# Patient Record
Sex: Male | Born: 1965 | Race: White | Hispanic: No | Marital: Married | Smoking: Never smoker
Health system: Southern US, Community
[De-identification: ages and names within clinical notes are randomized; demographics above are authoritative.]

## PROBLEM LIST (undated history)

## (undated) DIAGNOSIS — I493 Ventricular premature depolarization: Secondary | ICD-10-CM

## (undated) DIAGNOSIS — T7840XA Allergy, unspecified, initial encounter: Secondary | ICD-10-CM

## (undated) HISTORY — DX: Allergy, unspecified, initial encounter: T78.40XA

## (undated) HISTORY — PX: APPENDECTOMY: SHX54

## (undated) HISTORY — DX: Ventricular premature depolarization: I49.3

## (undated) HISTORY — PX: HERNIA REPAIR: SHX51

---

## 2005-06-11 ENCOUNTER — Emergency Department (HOSPITAL_COMMUNITY): Admission: EM | Admit: 2005-06-11 | Discharge: 2005-06-11 | Payer: Self-pay | Admitting: Emergency Medicine

## 2012-05-16 ENCOUNTER — Ambulatory Visit: Payer: PRIVATE HEALTH INSURANCE | Admitting: Emergency Medicine

## 2012-05-16 VITALS — BP 132/94 | HR 83 | Temp 97.8°F | Resp 16 | Ht 73.0 in | Wt 211.0 lb

## 2012-05-16 DIAGNOSIS — R51 Headache: Secondary | ICD-10-CM

## 2012-05-16 DIAGNOSIS — J329 Chronic sinusitis, unspecified: Secondary | ICD-10-CM

## 2012-05-16 DIAGNOSIS — Z23 Encounter for immunization: Secondary | ICD-10-CM

## 2012-05-16 MED ORDER — AMOXICILLIN-POT CLAVULANATE 875-125 MG PO TABS: 1.0000 | ORAL_TABLET | Freq: Two times a day (BID) | ORAL | Status: DC

## 2012-05-16 NOTE — Progress Notes (Signed)
  Subjective:    Patient ID: Leroy Pacheco, male    DOB: 24-Mar-1966, 47 y.o.   MRN: [id number]  HPIpatient complains of cold symptoms for one week. Has taken Zinc. Feeling worse this week. Has used a saline nasal rinse. Feels sinus pressure. Has cough, denies any fevers. Loss of vision at times during illness in right eye. No headache. Has had trouble in the past with loss of vision and headaches.      Review of Systems patient states for years he has had rare episodes of what sounds like ocular migraines. He has a visual disturbance associated with a cracked glass appearance. This is followed by a right temporal headache. He had these only rarely every 5-10 years but in the last week he has had 3 episodes 2 of which occurred yesterday. He develops a visual discomfort but does not get a headache.     Objective:   Physical Exam patient is alert cooperative in no distress. His pupils are equal and reactive to light. His disc margins are sharp. Cranial nerves are intact. He has no focal upper or lower extremity weakness in his neck is supple without adenopathy. His chest is clear to auscultation and percussion. His heart is regular rate without murmurs. There is tenderness over both maxillary sinuses and congestion of the turbinates        Assessment & Plan:  He is given instructions regarding treatment for sinusitis. We'll treat with Augmentin 875 twice a day. Referral has been made to neurology for evaluation of what sounds like ocular migraines.

## 2012-05-16 NOTE — Patient Instructions (Addendum)

## 2013-02-03 ENCOUNTER — Ambulatory Visit (INDEPENDENT_AMBULATORY_CARE_PROVIDER_SITE_OTHER): Payer: PRIVATE HEALTH INSURANCE | Admitting: Family Medicine

## 2013-02-03 VITALS — BP 122/78 | HR 71 | Temp 97.4°F | Resp 18 | Ht 73.0 in | Wt 213.0 lb

## 2013-02-03 DIAGNOSIS — R002 Palpitations: Secondary | ICD-10-CM

## 2013-02-03 DIAGNOSIS — I493 Ventricular premature depolarization: Secondary | ICD-10-CM

## 2013-02-03 DIAGNOSIS — I4949 Other premature depolarization: Secondary | ICD-10-CM

## 2013-02-03 NOTE — Progress Notes (Signed)
Is a 47 year old man is in [employer], is married, and is a Psychologist, prison and probation services. He is trace bicycles but several years ago he gave this up and start working out in the gym. He put on 50 pounds to bulk up.  Patient has a history of palpitations dating back to his college years. At that time he related the palpitations to drinking coffee and he was very sensitive to this. He gave up caffeine for while. It is racing bicycles he at one time took Claritin-D and which caused a severe reaction of facial flushing and rapid pulse.  In the last year, patient had an occasional cup of coffee. Yesterday and a couple coffee followed by significant palpitations all day. He again had a cup of coffee today and the palpitations were worse. He notices a funny tickle in his chest whenever the heart skips a beat. He's had no chest pain or shortness of breath however.  Objective: Middle-age man in no distress Chest: Clear Heart: Occasionally irregular with skipped beat, no murmur or gallop or rub. Skin: Clear  EKG: Patient has PVCs about every 8-10 beats.  Assessment: Is an athletic individual with a very sensitive electrical system of his heart. Unconcerned about the symptoms getting worse. I think that the caffeine is probably one of the triggers, any and 22 with Sudafed.  Plan: Check thyroid and lipid profiles along with a comprehensive metabolic profile.  I suspect patient will need a cardiology consult to consider beta-blockade.  Signed, Elvina Sidle, MD

## 2013-02-04 ENCOUNTER — Other Ambulatory Visit: Payer: Self-pay | Admitting: Family Medicine

## 2013-02-04 DIAGNOSIS — R002 Palpitations: Secondary | ICD-10-CM

## 2013-02-04 LAB — COMPREHENSIVE METABOLIC PANEL
ALT: 60 U/L — ABNORMAL HIGH (ref 0–53)
AST: 32 U/L (ref 0–37)
Albumin: 4.7 g/dL (ref 3.5–5.2)
Alkaline Phosphatase: 62 U/L (ref 39–117)
BUN: 20 mg/dL (ref 6–23)
CO2: 27 mEq/L (ref 19–32)
Calcium: 9.4 mg/dL (ref 8.4–10.5)
Chloride: 101 mEq/L (ref 96–112)
Creat: 1.33 mg/dL (ref 0.50–1.35)
Glucose, Bld: 84 mg/dL (ref 70–99)
Potassium: 4 mEq/L (ref 3.5–5.3)
Sodium: 139 mEq/L (ref 135–145)
Total Bilirubin: 0.9 mg/dL (ref 0.3–1.2)
Total Protein: 7.4 g/dL (ref 6.0–8.3)

## 2013-02-04 LAB — LIPID PANEL
Cholesterol: 193 mg/dL (ref 0–200)
HDL: 31 mg/dL — ABNORMAL LOW (ref >39)
LDL Cholesterol: 119 mg/dL — ABNORMAL HIGH (ref 0–99)
Total CHOL/HDL Ratio: 6.2 Ratio
Triglycerides: 214 mg/dL — ABNORMAL HIGH (ref <150)
VLDL: 43 mg/dL — ABNORMAL HIGH (ref 0–40)

## 2013-02-04 LAB — TSH: TSH: 1.65 u[IU]/mL (ref 0.350–4.500)

## 2013-03-04 ENCOUNTER — Ambulatory Visit (INDEPENDENT_AMBULATORY_CARE_PROVIDER_SITE_OTHER): Payer: No Typology Code available for payment source | Admitting: Cardiovascular Disease

## 2013-03-04 ENCOUNTER — Encounter: Payer: Self-pay | Admitting: Cardiovascular Disease

## 2013-03-04 VITALS — BP 130/92 | HR 71 | Ht 73.0 in | Wt 217.4 lb

## 2013-03-04 DIAGNOSIS — I4949 Other premature depolarization: Secondary | ICD-10-CM

## 2013-03-04 DIAGNOSIS — I493 Ventricular premature depolarization: Secondary | ICD-10-CM | POA: Insufficient documentation

## 2013-03-04 DIAGNOSIS — Z79899 Other long term (current) drug therapy: Secondary | ICD-10-CM

## 2013-03-04 DIAGNOSIS — R002 Palpitations: Secondary | ICD-10-CM

## 2013-03-04 DIAGNOSIS — E785 Hyperlipidemia, unspecified: Secondary | ICD-10-CM

## 2013-03-04 NOTE — Progress Notes (Signed)
03/04/2013 AKSH SWART   08-04-1965  [id number]  Primary Physician Nilda Simmer, MD Primary Cardiologist: Runell Gess MD Roseanne Reno   HPI:  Mr. 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 He was referred by Dr. Faustino Congress, urgent care for evaluation of PVCs. He has no cardiac risk factors. He exercises with weights daily basis. He does admit to caffeine intake which has since stopped as of 3 weeks ago when he noticed the PVCs. He still has had daily PVCs hence. EKG done in urgent care showed sinus rhythm with unifocal PVCs. Lab work was unrevealing.   Current Outpatient Prescriptions  Medication Sig Dispense Refill  . cetirizine (ZYRTEC) 10 MG tablet Take 10 mg by mouth daily.      . fish oil-omega-3 fatty acids 1000 MG capsule Take 2 g by mouth daily.      . Multiple Vitamin (MULTIVITAMIN) tablet Take 1 tablet by mouth daily.       No current facility-administered medications for this visit.    Allergies  Allergen Reactions  . Codeine Photosensitivity    History   Social History  . Marital Status: Married    Spouse Name: N/A    Number of Children: N/A  . Years of Education: N/A   Occupational History  . Not on file.   Social History Main Topics  . Smoking status: Never Smoker   . Smokeless tobacco: Not on file  . Alcohol Use: 1.0 oz/week    2 drink(s) per week  . Drug Use: Not on file  . Sexual Activity: Not on file   Other Topics Concern  . Not on file   Social History Narrative  . No narrative on file     Review of Systems: General: negative for chills, fever, night sweats or weight changes.  Cardiovascular: negative for chest pain, dyspnea on exertion, edema, orthopnea, palpitations, paroxysmal nocturnal dyspnea or shortness of breath Dermatological: negative for rash Respiratory: negative for cough or wheezing Urologic: negative for hematuria Abdominal:  negative for nausea, vomiting, diarrhea, bright red blood per rectum, melena, or hematemesis Neurologic: negative for visual changes, syncope, or dizziness All other systems reviewed and are otherwise negative except as noted above.    Blood pressure 130/92, pulse 71, height 6\' 1"  (1.854 m), weight 217 lb 6.4 oz (98.612 kg).  General appearance: alert and no distress Neck: no adenopathy, no carotid bruit, no JVD, supple, symmetrical, trachea midline and thyroid not enlarged, symmetric, no tenderness/mass/nodules Lungs: clear to auscultation bilaterally Heart: regular rate and rhythm, S1, S2 normal, no murmur, click, rub or gallop Abdomen: soft, non-tender; bowel sounds normal; no masses,  no organomegaly Extremities: extremities normal, atraumatic, no cyanosis or edema Pulses: 2+ and symmetric  EKG normal sinus rhythm at 71 with no ST or T wave changes  ASSESSMENT AND PLAN:   PVC's (premature ventricular contractions) Patient noticed onset PVCs approximately 3 weeks ago. He is very active fit-appearing 47 year old Caucasian male who exercises on a daily basis. He has no cardiac risk factors. He denies use of stimulants and has stopped his caffeine intake without change in the frequency or severity of severity of the symptoms. He's not had any syncope or presyncope. No associated chest pain or shortness of breath. He saw Dr. Faustino Congress at the emergency room where EKG did show sinus rhythm with occasional PVCs appears y 10 are normal. TSH was normal. His AST was mildly elevated. His HDL  was low at 31. Going to get a tourniquet a monitor and a 2-D echo I will see him back after that for further evaluation.      Runell Gess MD FACP,FACC,FAHA, [facility] 03/04/2013 2:57 PM

## 2013-03-04 NOTE — Patient Instructions (Signed)
  We will see you back in follow up in 3 months with Dr Allyson Sabal.  Dr Allyson Sabal has ordered an echocardiogram and a 2 week event monitor.    Please have blood work done in 3 months to check your cholesterol levels.

## 2013-03-04 NOTE — Assessment & Plan Note (Signed)
Patient noticed onset PVCs approximately 3 weeks ago. He is very active fit-appearing 47 year old Caucasian male who exercises on a daily basis. He has no cardiac risk factors. He denies use of stimulants and has stopped his caffeine intake without change in the frequency or severity of severity of the symptoms. He's not had any syncope or presyncope. No associated chest pain or shortness of breath. He saw Dr. Faustino Congress at the emergency room where EKG did show sinus rhythm with occasional PVCs appears y 10 are normal. TSH was normal. His AST was mildly elevated. His HDL was low at 31. Going to get a tourniquet a monitor and a 2-D echo I will see him back after that for further evaluation.

## 2013-03-05 ENCOUNTER — Encounter: Payer: Self-pay | Admitting: Cardiovascular Disease

## 2013-03-06 ENCOUNTER — Telehealth: Payer: Self-pay | Admitting: Cardiovascular Disease

## 2013-03-06 NOTE — Telephone Encounter (Signed)
Call from [employer].  Wanted to know if pt has had previous monitoring.  Reviewed chart.  New pt and no documentation of previous monitoring.  Informed and verbalized understanding.

## 2013-03-23 ENCOUNTER — Inpatient Hospital Stay (HOSPITAL_COMMUNITY): Admission: RE | Admit: 2013-03-23 | Payer: No Typology Code available for payment source | Source: Ambulatory Visit

## 2013-03-30 ENCOUNTER — Telehealth: Payer: Self-pay | Admitting: Cardiovascular Disease

## 2013-03-30 NOTE — Telephone Encounter (Signed)
Returning your call. °

## 2013-03-31 NOTE — Telephone Encounter (Signed)
lmom 

## 2013-04-01 NOTE — Telephone Encounter (Signed)
Spoke with patient and gave him his monitor results.

## 2013-04-09 ENCOUNTER — Ambulatory Visit (HOSPITAL_COMMUNITY)
Admission: RE | Admit: 2013-04-09 | Discharge: 2013-04-09 | Disposition: A | Discharge status: Home / Self Care | Payer: No Typology Code available for payment source | Source: Ambulatory Visit | Attending: Cardiovascular Disease | Admitting: Cardiovascular Disease

## 2013-04-09 DIAGNOSIS — I4949 Other premature depolarization: Secondary | ICD-10-CM | POA: Insufficient documentation

## 2013-04-09 DIAGNOSIS — I493 Ventricular premature depolarization: Secondary | ICD-10-CM

## 2013-04-09 NOTE — Progress Notes (Signed)
2D Echo Performed 04/09/2013    Leroy Pacheco, RCS  

## 2013-04-13 ENCOUNTER — Encounter: Payer: Self-pay | Admitting: *Deleted

## 2014-02-15 ENCOUNTER — Ambulatory Visit (INDEPENDENT_AMBULATORY_CARE_PROVIDER_SITE_OTHER): Payer: PRIVATE HEALTH INSURANCE | Admitting: Family Medicine

## 2014-02-15 VITALS — BP 116/68 | HR 87 | Temp 98.0°F | Resp 17 | Ht 72.5 in | Wt 209.0 lb

## 2014-02-15 DIAGNOSIS — J069 Acute upper respiratory infection, unspecified: Secondary | ICD-10-CM

## 2014-02-15 DIAGNOSIS — B9789 Other viral agents as the cause of diseases classified elsewhere: Principal | ICD-10-CM

## 2014-02-15 NOTE — Progress Notes (Signed)
Chief Complaint:  Chief Complaint  Patient presents with  . Cough    coughed up blood   . URI    HPI: Leroy Pacheco is a 48 y.o. male who is here for 8 day hx of flu like sxs, he had productive cough and bloody sputum x 1 this morning in the shower , he also gets bloody noses. He coughed so much this week due to the illness, it was dry but jut recently became productive green.   HE has had the flu before and  flet his knees achey and chills, and then Sunday had low energy. He thought he had a cold or flu and then nonproductive cough started on Monday, T max on 103 throught Friday.  He has tried Production designer, theatre/television/filmDayquil and Nyquil. He denies any recent trevels, TB exposure. No ear pain, no SOB, wheezing, palpitations.   Past Medical History  Diagnosis Date  . Allergy   . PVC's (premature ventricular contractions)    Past Surgical History  Procedure Laterality Date  . Appendectomy    . Hernia repair     History   Social History  . Marital Status: Married    Spouse Name: N/A    Number of Children: N/A  . Years of Education: N/A   Social History Main Topics  . Smoking status: Never Smoker   . Smokeless tobacco: None  . Alcohol Use: 1.0 oz/week    2 drink(s) per week  . Drug Use: No  . Sexual Activity: Yes    Birth Control/ Protection: None   Other Topics Concern  . None   Social History Narrative  . None   Family History  Problem Relation Age of Onset  . Cancer Father   . Stroke Maternal Grandmother   . COPD Maternal Grandfather   . Diabetes Paternal Grandmother    Allergies  Allergen Reactions  . Codeine Photosensitivity   Prior to Admission medications   Medication Sig Start Date End Date Taking? Authorizing Provider  cetirizine (ZYRTEC) 10 MG tablet Take 10 mg by mouth daily.    Historical Provider, MD  fish oil-omega-3 fatty acids 1000 MG capsule Take 2 g by mouth daily.    Historical Provider, MD  Multiple Vitamin (MULTIVITAMIN) tablet Take 1 tablet by mouth  daily.    Historical Provider, MD     ROS: The patient denies fevers, chills, night sweats, unintentional weight loss, chest pain, palpitations, wheezing, dyspnea on exertion, nausea, vomiting, abdominal pain, dysuria, hematuria, melena, numbness, weakness, or tingling.   All other systems have been reviewed and were otherwise negative with the exception of those mentioned in the HPI and as above.    PHYSICAL EXAM: Filed Vitals:   02/15/14 0818  BP: 116/68  Pulse: 87  Temp: 98 F (36.7 C)  Resp: 17   Filed Vitals:   02/15/14 0818  Height: 6' 0.5" (1.842 m)  Weight: 209 lb (94.802 kg)   Body mass index is 27.94 kg/(m^2).  General: Alert, no acute distress HEENT:  Normocephalic, atraumatic, oropharynx patent. EOMI, PERRLA, + canker sores, minimal erythema, nasal mucoa without e.o bleeding, no exudates. + PND Cardiovascular:  Regular rate and rhythm, no rubs murmurs or gallops.  No Carotid bruits, radial pulse intact. No pedal edema.  Respiratory: Clear to auscultation bilaterally.  No wheezes, rales, or rhonchi.  No cyanosis, no use of accessory musculature GI: No organomegaly, abdomen is soft and non-tender, positive bowel sounds.  No masses. Skin: No rashes. Neurologic:  Facial musculature symmetric. Psychiatric: Patient is appropriate throughout our interaction. Lymphatic: No cervical lymphadenopathy Musculoskeletal: Gait intact.   LABS: Results for orders placed in visit on 02/03/13  TSH      Result Value Ref Range   TSH 1.650  0.350 - 4.500 uIU/mL  COMPREHENSIVE METABOLIC PANEL      Result Value Ref Range   Sodium 139  135 - 145 mEq/L   Potassium 4.0  3.5 - 5.3 mEq/L   Chloride 101  96 - 112 mEq/L   CO2 27  19 - 32 mEq/L   Glucose, Bld 84  70 - 99 mg/dL   BUN 20  6 - 23 mg/dL   Creat 1.611.33  0.960.50 - 0.451.35 mg/dL   Total Bilirubin 0.9  0.3 - 1.2 mg/dL   Alkaline Phosphatase 62  39 - 117 U/L   AST 32  0 - 37 U/L   ALT 60 (*) 0 - 53 U/L   Total Protein 7.4  6.0 - 8.3  g/dL   Albumin 4.7  3.5 - 5.2 g/dL   Calcium 9.4  8.4 - 40.910.5 mg/dL  LIPID PANEL      Result Value Ref Range   Cholesterol 193  0 - 200 mg/dL   Triglycerides 811214 (*) <150 mg/dL   HDL 31 (*) >91>39 mg/dL   Total CHOL/HDL Ratio 6.2     VLDL 43 (*) 0 - 40 mg/dL   LDL Cholesterol 478119 (*) 0 - 99 mg/dL     EKG/XRAY:   Primary [facility]   ASSESSMENT/PLAN: Encounter Diagnosis  Name Primary?  . Viral URI with cough Yes   Most likely viral in origin He is doing better He declines xrays. PE and VSS so will be ok to defer.  Sx treatment for cough F/u prn if wants chest xray or if continue to have bloody cough, this was one episode so I suspsect just due to excessive cough and nothing else.   Gross sideeffects, risk and benefits, and alternatives of medications d/w patient. Patient is aware that all medications have potential sideeffects and we are unable to predict every sideeffect or drug-drug interaction that may occur.  Marquite Attwood PHUONG, DO 02/15/2014 8:56 AM

## 2014-02-15 NOTE — Patient Instructions (Addendum)
Upper Respiratory Infection, Adult °An upper respiratory infection (URI) is also known as the common cold. It is often caused by a type of germ (virus). Colds are easily spread (contagious). You can pass it to others by kissing, coughing, sneezing, or drinking out of the same glass. Usually, you get better in 1 or 2 weeks.  °HOME CARE  °· Only take medicine as told by your doctor. °· Use a warm mist humidifier or breathe in steam from a hot shower. °· Drink enough water and fluids to keep your pee (urine) clear or pale yellow. °· Get plenty of rest. °· Return to work when your temperature is back to normal or as told by your doctor. You may use a face mask and wash your hands to stop your cold from spreading. °GET HELP RIGHT AWAY IF:  °· After the first few days, you feel you are getting worse. °· You have questions about your medicine. °· You have chills, shortness of breath, or brown or red spit (mucus). °· You have yellow or brown snot (nasal discharge) or pain in the face, especially when you bend forward. °· You have a fever, puffy (swollen) neck, pain when you swallow, or white spots in the back of your throat. °· You have a bad headache, ear pain, sinus pain, or chest pain. °· You have a high-pitched whistling sound when you breathe in and out (wheezing). °· You have a lasting cough or cough up blood. °· You have sore muscles or a stiff neck. °MAKE SURE YOU:  °· Understand these instructions. °· Will watch your condition. °· Will get help right away if you are not doing well or get worse. °Document Released: 10/10/2007 Document Revised: 07/16/2011 Document Reviewed: 07/29/2013 °ExitCare® Patient Information ©2015 [employer]. This information is not intended to replace advice given to you by your health care provider. Make sure you discuss any questions you have with your health care provider. °Cough, Adult ° A cough is a reflex that helps clear your throat and airways. It can help heal the body or may be a  reaction to an irritated airway. A cough may only last 2 or 3 weeks (acute) or may last more than 8 weeks (chronic).  °CAUSES °Acute cough: °· Viral or bacterial infections. °Chronic cough: °· Infections. °· Allergies. °· Asthma. °· Post-nasal drip. °· Smoking. °· Heartburn or acid reflux. °· Some medicines. °· Chronic lung problems (COPD). °· Cancer. °SYMPTOMS  °· Cough. °· Fever. °· Chest pain. °· Increased breathing rate. °· High-pitched whistling sound when breathing (wheezing). °· Colored mucus that you cough up (sputum). °TREATMENT  °· A bacterial cough may be treated with antibiotic medicine. °· A viral cough must run its course and will not respond to antibiotics. °· Your caregiver may recommend other treatments if you have a chronic cough. °HOME CARE INSTRUCTIONS  °· Only take over-the-counter or prescription medicines for pain, discomfort, or fever as directed by your caregiver. Use cough suppressants only as directed by your caregiver. °· Use a cold steam vaporizer or humidifier in your bedroom or home to help loosen secretions. °· Sleep in a semi-upright position if your cough is worse at night. °· Rest as needed. °· Stop smoking if you smoke. °SEEK IMMEDIATE MEDICAL CARE IF:  °· You have pus in your sputum. °· Your cough starts to worsen. °· You cannot control your cough with suppressants and are losing sleep. °· You begin coughing up blood. °· You have difficulty breathing. °· You   develop pain which is getting worse or is uncontrolled with medicine. °· You have a fever. °MAKE SURE YOU:  °· Understand these instructions. °· Will watch your condition. °· Will get help right away if you are not doing well or get worse. °Document Released: 10/20/2010 Document Revised: 07/16/2011 Document Reviewed: 10/20/2010 °ExitCare® Patient Information ©2015 [employer]. This information is not intended to replace advice given to you by your health care provider. Make sure you discuss any questions you have with your  health care provider. ° °

## 2014-05-26 ENCOUNTER — Ambulatory Visit (INDEPENDENT_AMBULATORY_CARE_PROVIDER_SITE_OTHER): Payer: No Typology Code available for payment source | Admitting: Family Medicine

## 2014-05-26 VITALS — BP 128/74 | HR 84 | Temp 97.6°F | Resp 16 | Ht 73.5 in | Wt 221.0 lb

## 2014-05-26 DIAGNOSIS — R0981 Nasal congestion: Secondary | ICD-10-CM

## 2014-05-26 DIAGNOSIS — Z23 Encounter for immunization: Secondary | ICD-10-CM

## 2014-05-26 MED ORDER — AMOXICILLIN 500 MG PO CAPS: 1000.0000 mg | ORAL_CAPSULE | Freq: Two times a day (BID) | ORAL | Status: DC

## 2014-05-26 NOTE — Progress Notes (Signed)
[facility] [address] [place] [address] [phone]  Date:  05/26/2014   Name:  Leroy Pacheco   DOB:  1966-01-09   MRN:  [id number]  PCP:  Nilda Simmer, MD    Chief Complaint: Sinus Problem   History of Present Illness:  Leroy Pacheco is a 49 y.o. very pleasant male patient who presents with the following:  He is here today with illness.  About 8 days ago he noted onset of sinus congestion and headache.  He used some OTC medications and seemed to be doing ok.  Sx got better but then came back over the last day.  He has noted some pressure and "crackling noises" in his sinuses today, and has a mild cough.  He notes that he is blowing out some discolored mucus over the last 24 hours.  He was not sure if he might be getting a sinus infection and wanted to be seen "before it gets worse."  However he notes that "from the nose down I feel great," he is able to exercise and do his usual activities He is generally healthy  He is still eating well, no fever, energy level is ok.   He uses a decongestant today- no other medications so far  Patient Active Problem List   Diagnosis Date Noted  . PVC's (premature ventricular contractions) 03/04/2013    Past Medical History  Diagnosis Date  . Allergy   . PVC's (premature ventricular contractions)     Past Surgical History  Procedure Laterality Date  . Appendectomy    . Hernia repair      History  Substance Use Topics  . Smoking status: Never Smoker   . Smokeless tobacco: Not on file  . Alcohol Use: 1.0 oz/week    2 drink(s) per week    Family History  Problem Relation Age of Onset  . Cancer Father   . Stroke Maternal Grandmother   . COPD Maternal Grandfather   . Diabetes Paternal Grandmother     Allergies  Allergen Reactions  . Codeine Photosensitivity    Medication list has been reviewed and updated.  Current Outpatient Prescriptions on File Prior to Visit  Medication Sig Dispense Refill  .  fish oil-omega-3 fatty acids 1000 MG capsule Take 2 g by mouth daily.     No current facility-administered medications on file prior to visit.    Review of Systems:  As per HPI- otherwise negative.   Physical Examination: Filed Vitals:   05/26/14 1327  BP: 128/74  Pulse: 84  Temp: 97.6 F (36.4 C)  Resp: 16   Filed Vitals:   05/26/14 1327  Height: 6' 1.5" (1.867 m)  Weight: 221 lb (100.245 kg)   Body mass index is 28.76 kg/(m^2). Ideal Body Weight: Weight in (lb) to have BMI = 25: 191.7  GEN: WDWN, NAD, Non-toxic, A & O x 3, looks well HEENT: Atraumatic, Normocephalic. Neck supple. No masses, No LAD.  Bilateral TM wnl, oropharynx normal.  PEERL,EOMI.   Mild nasal congestion Ears and Nose: No external deformity. CV: RRR, No M/G/R. No JVD. No thrill. No extra heart sounds. PULM: CTA B, no wheezes, crackles, rhonchi. No retractions. No resp. distress. No accessory muscle use. EXTR: No c/c/e NEURO Normal gait.  PSYCH: Normally interactive. Conversant. Not depressed or anxious appearing.  Calm demeanor.   Assessment and Plan: Nasal congestion - Plan: amoxicillin (AMOXIL) 500 MG capsule  Immunization due - Plan: Flu Vaccine QUAD 36+ mos IM  He would like to get a flu shot today which is fine.   Reassured that he likely does not have a bacterial sinusitis based on his sx. He has noted nasal congestion for less than 24 hours.  Gave him an amox rx to hold as he was just sick last week and this could turn out to be "double sickening" typical of sinusitis.  He is comfortable with this plan and will use the amoxicillin if he does not feel better after a few days   Signed Abbe AmsterdamJessica Naseer Hearn, MD

## 2014-05-26 NOTE — Patient Instructions (Addendum)
I think that you have a viral infection- hold onto the amoxicilin rx and and use only if you are not feeling better in the next 3-4 days  Use OTC medications as needed, mucinex DM may be a good choice Let us know if you are not improved soon- sooner if you are getting worse!

## 2016-06-07 ENCOUNTER — Ambulatory Visit (INDEPENDENT_AMBULATORY_CARE_PROVIDER_SITE_OTHER): Payer: No Typology Code available for payment source | Admitting: Family Medicine

## 2016-06-07 VITALS — BP 122/82 | HR 98 | Temp 97.6°F | Ht 73.5 in | Wt 225.4 lb

## 2016-06-07 DIAGNOSIS — R0981 Nasal congestion: Secondary | ICD-10-CM | POA: Diagnosis not present

## 2016-06-07 DIAGNOSIS — J0100 Acute maxillary sinusitis, unspecified: Secondary | ICD-10-CM | POA: Diagnosis not present

## 2016-06-07 MED ORDER — AMOXICILLIN 500 MG PO CAPS: 1000.0000 mg | ORAL_CAPSULE | Freq: Two times a day (BID) | ORAL | 0 refills | Status: DC

## 2016-06-07 NOTE — Progress Notes (Signed)
Subjective:    Patient ID: Leroy Pacheco, male    DOB: 1965-05-27, 51 y.o.   MRN: [id number]  06/07/2016  Nasal Congestion (X 2 week); Cough (X 2 weeks); and Sore Throat (X 3-4 days)   HPI This 51 y.o. male presents for evaluation of sinus congestion and cough and sore throat.  Onset three days ago.  No fever/chills/sweats.  +HA.  +nasal congestion. +rhinorrhea; +nasal congestion. +PND.  +coughing for two weeks; no SOB.  No n/v/d.  No body aches.   Review of Systems  Constitutional: Negative for activity change, appetite change, chills, diaphoresis, fatigue and fever.  HENT: Positive for congestion, rhinorrhea, sore throat and voice change. Negative for ear pain, sinus pain and trouble swallowing.   Eyes: Negative for visual disturbance.  Respiratory: Positive for cough. Negative for shortness of breath.   Cardiovascular: Negative for chest pain, palpitations and leg swelling.  Gastrointestinal: Negative for diarrhea and nausea.  Endocrine: Negative for cold intolerance, heat intolerance, polydipsia, polyphagia and polyuria.  Neurological: Negative for dizziness, tremors, seizures, syncope, facial asymmetry, speech difficulty, weakness, light-headedness, numbness and headaches.    Past Medical History:  Diagnosis Date  . Allergy   . PVC's (premature ventricular contractions)    Past Surgical History:  Procedure Laterality Date  . APPENDECTOMY    . HERNIA REPAIR     Allergies  Allergen Reactions  . Codeine Photosensitivity    Social History   Social History  . Marital status: Married    Spouse name: N/A  . Number of children: N/A  . Years of education: N/A   Occupational History  . Not on file.   Social History Main Topics  . Smoking status: Never Smoker  . Smokeless tobacco: Never Used  . Alcohol use 1.0 oz/week    2 Standard drinks or equivalent per week  . Drug use: No  . Sexual activity: Yes    Birth control/ protection: None   Other Topics Concern  . Not  on file   Social History Narrative  . No narrative on file   Family History  Problem Relation Age of Onset  . Cancer Father   . Stroke Maternal Grandmother   . COPD Maternal Grandfather   . Diabetes Paternal Grandmother        Objective:    BP 122/82   Pulse 98   Temp 97.6 F (36.4 C) (Oral)   Ht 6' 1.5" (1.867 m)   Wt 225 lb 6.4 oz (102.2 kg)   SpO2 98%   BMI 29.33 kg/m  Physical Exam  Constitutional: He is oriented to person, place, and time. He appears well-developed and well-nourished. No distress.  HENT:  Head: Normocephalic and atraumatic.  Right Ear: Tympanic membrane, external ear and ear canal normal.  Left Ear: Tympanic membrane, external ear and ear canal normal.  Nose: Right sinus exhibits maxillary sinus tenderness. Right sinus exhibits no frontal sinus tenderness. Left sinus exhibits maxillary sinus tenderness. Left sinus exhibits no frontal sinus tenderness.  Mouth/Throat: Uvula is midline, oropharynx is clear and moist and mucous membranes are normal.  Eyes: Conjunctivae and EOM are normal. Pupils are equal, round, and reactive to light.  Neck: Normal range of motion. Neck supple. Carotid bruit is not present. No thyromegaly present.  Cardiovascular: Normal rate, regular rhythm, normal heart sounds and intact distal pulses.  Exam reveals no gallop and no friction rub.   No murmur heard. Pulmonary/Chest: Effort normal and breath sounds normal. He has no wheezes. He has  no rales.  Lymphadenopathy:    He has no cervical adenopathy.  Neurological: He is alert and oriented to person, place, and time. No cranial nerve deficit.  Skin: Skin is warm and dry. No rash noted. He is not diaphoretic.  Psychiatric: He has a normal mood and affect. His behavior is normal.  Nursing note and vitals reviewed.       Assessment & Plan:   1. Acute non-recurrent maxillary sinusitis   2. Nasal congestion     No orders of the defined types were placed in this  encounter.  Meds ordered this encounter  Medications  . OVER THE COUNTER MEDICATION    Sig: Use as directed in the mouth or throat 1 day or 1 dose.  . fluticasone (FLONASE) 50 MCG/ACT nasal spray    Sig: Place into both nostrils daily.  Marland Kitchen. DISCONTD: amoxicillin (AMOXIL) 500 MG capsule    Sig: Take 2 capsules (1,000 mg total) by mouth 2 (two) times daily.    Dispense:  40 capsule    Refill:  0  . DISCONTD: amoxicillin (AMOXIL) 500 MG capsule    Sig: Take 2 capsules (1,000 mg total) by mouth 2 (two) times daily.    Dispense:  40 capsule    Refill:  0    No Follow-up on file.   Nickcole Bralley Paulita FujitaMartin Nayanna Seaborn, M.D. [facility]  [place] [facility] [address] [place], [place]  [id number] [phone] phone [phone] fax

## 2016-06-07 NOTE — Patient Instructions (Addendum)
   IF you received an x-ray today, you will receive an invoice from [facility]. Please contact  Radiology at [phone] with questions or concerns regarding your invoice.   IF you received labwork today, you will receive an invoice from [facility]. Please contact [facility] at [phone] with questions or concerns regarding your invoice.   Our billing staff will not be able to assist you with questions regarding bills from these companies.  You will be contacted with the lab results as soon as they are available. The fastest way to get your results is to activate your My Chart account. Instructions are located on the last page of this paperwork. If you have not heard from us regarding the results in 2 weeks, please contact this office.      Sinusitis, Adult Sinusitis is soreness and inflammation of your sinuses. Sinuses are hollow spaces in the bones around your face. Your sinuses are located:  Around your eyes.  In the middle of your forehead.  Behind your nose.  In your cheekbones. Your sinuses and nasal passages are lined with a stringy fluid (mucus). Mucus normally drains out of your sinuses. When your nasal tissues become inflamed or swollen, the mucus can become trapped or blocked so air cannot flow through your sinuses. This allows bacteria, viruses, and funguses to grow, which leads to infection. Sinusitis can develop quickly and last for 7?10 days (acute) or for more than 12 weeks (chronic). Sinusitis often develops after a cold. What are the causes? This condition is caused by anything that creates swelling in the sinuses or stops mucus from draining, including:  Allergies.  Asthma.  Bacterial or viral infection.  Abnormally shaped bones between the nasal passages.  Nasal growths that contain mucus (nasal polyps).  Narrow sinus openings.  Pollutants, such as chemicals or irritants in the air.  A foreign object stuck in the nose.  A fungal  infection. This is rare. What increases the risk? The following factors may make you more likely to develop this condition:  Having allergies or asthma.  Having had a recent cold or respiratory tract infection.  Having structural deformities or blockages in your nose or sinuses.  Having a weak immune system.  Doing a lot of swimming or diving.  Overusing nasal sprays.  Smoking. What are the signs or symptoms? The main symptoms of this condition are pain and a feeling of pressure around the affected sinuses. Other symptoms include:  Upper toothache.  Earache.  Headache.  Bad breath.  Decreased sense of smell and taste.  A cough that may get worse at night.  Fatigue.  Fever.  Thick drainage from your nose. The drainage is often green and it may contain pus (purulent).  Stuffy nose or congestion.  Postnasal drip. This is when extra mucus collects in the throat or back of the nose.  Swelling and warmth over the affected sinuses.  Sore throat.  Sensitivity to light. How is this diagnosed? This condition is diagnosed based on symptoms, a medical history, and a physical exam. To find out if your condition is acute or chronic, your health care provider may:  Look in your nose for signs of nasal polyps.  Tap over the affected sinus to check for signs of infection.  View the inside of your sinuses using an imaging device that has a light attached (endoscope). If your health care provider suspects that you have chronic sinusitis, you may also:  Be tested for allergies.  Have a sample of   mucus taken from your nose (nasal culture) and checked for bacteria.  Have a mucus sample examined to see if your sinusitis is related to an allergy. If your sinusitis does not respond to treatment and it lasts longer than 8 weeks, you may have an MRI or CT scan to check your sinuses. These scans also help to determine how severe your infection is. In rare cases, a bone biopsy may  be done to rule out more serious types of fungal sinus disease. How is this treated? Treatment for sinusitis depends on the cause and whether your condition is chronic or acute. If a virus is causing your sinusitis, your symptoms will go away on their own within 10 days. You may be given medicines to relieve your symptoms, including:  Topical nasal decongestants. They shrink swollen nasal passages and let mucus drain from your sinuses.  Antihistamines. These drugs block inflammation that is triggered by allergies. This can help to ease swelling in your nose and sinuses.  Topical nasal corticosteroids. These are nasal sprays that ease inflammation and swelling in your nose and sinuses.  Nasal saline washes. These rinses can help to get rid of thick mucus in your nose. If your condition is caused by bacteria, you will be given an antibiotic medicine. If your condition is caused by a fungus, you will be given an antifungal medicine. Surgery may be needed to correct underlying conditions, such as narrow nasal passages. Surgery may also be needed to remove polyps. Follow these instructions at home: Medicines   Take, use, or apply over-the-counter and prescription medicines only as told by your health care provider. These may include nasal sprays.  If you were prescribed an antibiotic medicine, take it as told by your health care provider. Do not stop taking the antibiotic even if you start to feel better. Hydrate and Humidify   Drink enough water to keep your urine clear or pale yellow. Staying hydrated will help to thin your mucus.  Use a cool mist humidifier to keep the humidity level in your home above 50%.  Inhale steam for 10-15 minutes, 3-4 times a day or as told by your health care provider. You can do this in the bathroom while a hot shower is running.  Limit your exposure to cool or dry air. Rest   Rest as much as possible.  Sleep with your head raised (elevated).  Make sure to  get enough sleep each night. General instructions   Apply a warm, moist washcloth to your face 3-4 times a day or as told by your health care provider. This will help with discomfort.  Wash your hands often with soap and water to reduce your exposure to viruses and other germs. If soap and water are not available, use hand sanitizer.  Do not smoke. Avoid being around people who are smoking (secondhand smoke).  Keep all follow-up visits as told by your health care provider. This is important. Contact a health care provider if:  You have a fever.  Your symptoms get worse.  Your symptoms do not improve within 10 days. Get help right away if:  You have a severe headache.  You have persistent vomiting.  You have pain or swelling around your face or eyes.  You have vision problems.  You develop confusion.  Your neck is stiff.  You have trouble breathing. This information is not intended to replace advice given to you by your health care provider. Make sure you discuss any questions you have with   your health care provider. Document Released: 04/23/2005 Document Revised: 12/18/2015 Document Reviewed: 02/16/2015 Elsevier Interactive Patient Education  2017 Elsevier Inc.  

## 2016-07-04 ENCOUNTER — Ambulatory Visit (INDEPENDENT_AMBULATORY_CARE_PROVIDER_SITE_OTHER): Payer: No Typology Code available for payment source | Admitting: Family Medicine

## 2016-07-04 VITALS — BP 134/85 | HR 68 | Temp 98.3°F | Resp 16 | Ht 73.5 in | Wt 226.4 lb

## 2016-07-04 DIAGNOSIS — J0100 Acute maxillary sinusitis, unspecified: Secondary | ICD-10-CM

## 2016-07-04 MED ORDER — AMOXICILLIN 875 MG PO TABS: 875.0000 mg | ORAL_TABLET | Freq: Two times a day (BID) | ORAL | 0 refills | Status: DC

## 2016-07-04 NOTE — Progress Notes (Signed)
   SUBJECTIVE: URI symptoms:  Leroy Pacheco is a 51 y.o. male who complains of URI symptoms present for past 7 - 10 days.  Describes rhinorrhea, sinus congestion, mild cough. Sinus congestion by far the worst symptom.  Difficulty breathing through nose. Has tried OTC cold medicines without relief.  Also his flonase.  No fevers or chills. No nausea or vomiting.  Denies smoking cigarettes.  ROS as above.    PMH reviewed. Patient is a nonsmoker.   Medications reviewed.  Physical Exam:  BP 134/85 (BP Location: Right Arm, Patient Position: Sitting, Cuff Size: Large)   Pulse 68   Temp 98.3 F (36.8 C) (Oral)   Resp 16   Ht 6' 1.5" (1.867 m)   Wt 226 lb 6.4 oz (102.7 kg)   SpO2 97%   BMI 29.46 kg/m  Gen:  Patient sitting on exam table, appears stated age in no acute distress Head: Normocephalic atraumatic Eyes: EOMI, PERRL, sclera and conjunctiva non-erythematous Ears:  Canals clear bilaterally.  TMs pearly gray bilaterally without erythema or bulging.   Nose:  Nasal turbinates grossly enlarged bilaterally with exudates. Tender to palpation of maxillary sinus  Mouth: Mucosa membranes moist. Tonsils +2, nonenlarged, non-erythematous. Neck: No cervical lymphadenopathy noted Heart:  RRR, no murmurs auscultated. Pulm:  Clear to auscultation bilaterally with good air movement.  No wheezes or rales noted.    Assessment and Plan:  1.  Sinusitis: - amoxicillin for next 10 days - continue symptomatic treatments  - FU in 10 days if no improvement.  Sooner if worsening.

## 2016-07-04 NOTE — Patient Instructions (Addendum)
  I have sent in the amoxicillin at the slightly higher dose for you.    Take this for the next 10 days.  If you're not better in the next 2 weeks, let us know.  Don't wait to come back if this starts to worsen despite treatment.    It was good to meet you today   IF you received an x-ray today, you will receive an invoice from [facility]. Please contact [facility] at [phone] with questions or concerns regarding your invoice.   IF you received labwork today, you will receive an invoice from [facility]. Please contact [facility] at [phone] with questions or concerns regarding your invoice.   Our billing staff will not be able to assist you with questions regarding bills from these companies.  You will be contacted with the lab results as soon as they are available. The fastest way to get your results is to activate your My Chart account. Instructions are located on the last page of this paperwork. If you have not heard from us regarding the results in 2 weeks, please contact this office.

## 2016-08-16 ENCOUNTER — Ambulatory Visit (INDEPENDENT_AMBULATORY_CARE_PROVIDER_SITE_OTHER): Payer: No Typology Code available for payment source | Admitting: Physician Assistant

## 2016-08-16 VITALS — BP 118/78 | HR 85 | Temp 98.0°F | Resp 17 | Ht 73.5 in | Wt 224.0 lb

## 2016-08-16 DIAGNOSIS — J069 Acute upper respiratory infection, unspecified: Secondary | ICD-10-CM

## 2016-08-16 LAB — POCT SEDIMENTATION RATE: POCT SED RATE: 20 mm/hr (ref 0–22)

## 2016-08-16 NOTE — Patient Instructions (Signed)
     IF you received an x-ray today, you will receive an invoice from [facility]. Please contact [facility] at [phone] with questions or concerns regarding your invoice.   IF you received labwork today, you will receive an invoice from [facility]. Please contact [facility] at [phone] with questions or concerns regarding your invoice.   Our billing staff will not be able to assist you with questions regarding bills from these companies.  You will be contacted with the lab results as soon as they are available. The fastest way to get your results is to activate your My Chart account. Instructions are located on the last page of this paperwork. If you have not heard from us regarding the results in 2 weeks, please contact this office.     

## 2016-08-16 NOTE — Progress Notes (Signed)
08/16/2016 10:43 AM   DOB: 15-Dec-1965 / MRN: [id number]  SUBJECTIVE:  Leroy Pacheco is a 51 y.o. male presenting for nasal congestion, sinus pressure and nasal discharge consisting of "chunky bacon buggers"  Associates some pressure in his his neck and thinks this is coming for his "glands."  He uses chronic Flonase therapy for allergy control. He is a Chief Financial Officer. Say that he feels fine from the neck down.  He does feel that he is getting better. He has not tried any medication for this episode.   He is allergic to codeine.   He  has a past medical history of Allergy and PVC's (premature ventricular contractions).    He  reports that he has never smoked. He has never used smokeless tobacco. He reports that he drinks about 1.0 oz of alcohol per week . He reports that he does not use drugs. He  reports that he currently engages in sexual activity. He reports using the following method of birth control/protection: None. The patient  has a past surgical history that includes Appendectomy and Hernia repair.  His family history includes COPD in his maternal grandfather; Cancer in his father; Diabetes in his paternal grandmother; Stroke in his maternal grandmother.  Review of Systems  Constitutional: Negative for chills, diaphoresis and fever.  Eyes: Negative.   Respiratory: Positive for cough. Negative for hemoptysis.   Gastrointestinal: Negative for nausea.  Skin: Negative for rash.  Neurological: Negative for dizziness, sensory change, speech change, focal weakness and headaches.    The problem list and medications were reviewed and updated by myself where necessary and exist elsewhere in the encounter.   OBJECTIVE:  BP 118/78 (BP Location: Right Arm, Patient Position: Sitting, Cuff Size: Normal)   Pulse 85   Temp 98 F (36.7 C) (Oral)   Resp 17   Ht 6' 1.5" (1.867 m)   Wt 224 lb (101.6 kg)   SpO2 94%   BMI 29.15 kg/m   Physical Exam  Constitutional: He appears  well-developed. He is active and cooperative.  Non-toxic appearance.  HENT:  Right Ear: Hearing, tympanic membrane, external ear and ear canal normal.  Left Ear: Hearing, tympanic membrane, external ear and ear canal normal.  Nose: Nose normal. Right sinus exhibits no maxillary sinus tenderness and no frontal sinus tenderness. Left sinus exhibits no maxillary sinus tenderness and no frontal sinus tenderness.  Mouth/Throat: Uvula is midline, oropharynx is clear and moist and mucous membranes are normal. No oropharyngeal exudate, posterior oropharyngeal edema or tonsillar abscesses.  Eyes: Conjunctivae are normal. Pupils are equal, round, and reactive to light.  Cardiovascular: Normal rate.   Pulmonary/Chest: Effort normal. No tachypnea.  Lymphadenopathy:       Head (right side): No submandibular and no tonsillar adenopathy present.       Head (left side): No submandibular and no tonsillar adenopathy present.    He has no cervical adenopathy.  Neurological: He is alert.  Skin: Skin is warm and dry. He is not diaphoretic. No pallor.  Vitals reviewed.   Results for orders placed or performed in visit on 08/16/16 (from the past 72 hour(s))  POCT SEDIMENTATION RATE     Status: None   Collection Time: 08/16/16 10:37 AM  Result Value Ref Range   POCT SED RATE 20 0 - 22 mm/hr    No results found.  ASSESSMENT AND PLAN:  Abbas was seen today for nasal congestion, sinusitis and adenopathy.  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  If sed rate is positive will call in ABX.  If negative he will give it time.  -     POCT SEDIMENTATION RATE    The patient is advised to call or return to clinic if he does not see an improvement in symptoms, or to seek the care of the closest emergency department if he worsens with the above plan.   Deliah Boston, MHS, PA-C [facility] 08/16/2016 10:43 AM

## 2017-09-30 ENCOUNTER — Encounter: Payer: Self-pay | Admitting: Family Medicine

## 2018-12-29 ENCOUNTER — Encounter (HOSPITAL_COMMUNITY): Payer: Self-pay

## 2018-12-29 ENCOUNTER — Emergency Department (HOSPITAL_COMMUNITY)
Admission: EM | Admit: 2018-12-29 | Discharge: 2018-12-30 | Disposition: A | Discharge status: Home / Self Care | Payer: 59 | Attending: Emergency Medicine | Admitting: Emergency Medicine

## 2018-12-29 ENCOUNTER — Other Ambulatory Visit: Payer: Self-pay

## 2018-12-29 DIAGNOSIS — S81812A Laceration without foreign body, left lower leg, initial encounter: Secondary | ICD-10-CM | POA: Diagnosis not present

## 2018-12-29 DIAGNOSIS — I10 Essential (primary) hypertension: Secondary | ICD-10-CM | POA: Diagnosis present

## 2018-12-29 DIAGNOSIS — Y939 Activity, unspecified: Secondary | ICD-10-CM | POA: Insufficient documentation

## 2018-12-29 DIAGNOSIS — Z79899 Other long term (current) drug therapy: Secondary | ICD-10-CM | POA: Insufficient documentation

## 2018-12-29 DIAGNOSIS — Y929 Unspecified place or not applicable: Secondary | ICD-10-CM | POA: Diagnosis not present

## 2018-12-29 DIAGNOSIS — R51 Headache: Secondary | ICD-10-CM | POA: Insufficient documentation

## 2018-12-29 DIAGNOSIS — W2209XA Striking against other stationary object, initial encounter: Secondary | ICD-10-CM | POA: Insufficient documentation

## 2018-12-29 DIAGNOSIS — Z23 Encounter for immunization: Secondary | ICD-10-CM | POA: Diagnosis not present

## 2018-12-29 DIAGNOSIS — Y999 Unspecified external cause status: Secondary | ICD-10-CM | POA: Insufficient documentation

## 2018-12-29 LAB — CBC WITH DIFFERENTIAL/PLATELET
Abs Immature Granulocytes: 0.03 10*3/uL (ref 0.00–0.07)
Basophils Absolute: 0.1 10*3/uL (ref 0.0–0.1)
Basophils Relative: 1 %
Eosinophils Absolute: 0.2 10*3/uL (ref 0.0–0.5)
Eosinophils Relative: 3 %
HCT: 47.1 % (ref 39.0–52.0)
Hemoglobin: 16.7 g/dL (ref 13.0–17.0)
Immature Granulocytes: 0 %
Lymphocytes Relative: 31 %
Lymphs Abs: 2.3 10*3/uL (ref 0.7–4.0)
MCH: 29.6 pg (ref 26.0–34.0)
MCHC: 35.5 g/dL (ref 30.0–36.0)
MCV: 83.5 fL (ref 80.0–100.0)
Monocytes Absolute: 0.7 10*3/uL (ref 0.1–1.0)
Monocytes Relative: 9 %
Neutro Abs: 4.1 10*3/uL (ref 1.7–7.7)
Neutrophils Relative %: 56 %
Platelets: 280 10*3/uL (ref 150–400)
RBC: 5.64 MIL/uL (ref 4.22–5.81)
RDW: 12.5 % (ref 11.5–15.5)
WBC: 7.3 10*3/uL (ref 4.0–10.5)
nRBC: 0 % (ref 0.0–0.2)

## 2018-12-29 LAB — COMPREHENSIVE METABOLIC PANEL
ALT: 62 U/L — ABNORMAL HIGH (ref 0–44)
AST: 37 U/L (ref 15–41)
Albumin: 4.4 g/dL (ref 3.5–5.0)
Alkaline Phosphatase: 61 U/L (ref 38–126)
Anion gap: 12 (ref 5–15)
BUN: 17 mg/dL (ref 6–20)
CO2: 23 mmol/L (ref 22–32)
Calcium: 9.3 mg/dL (ref 8.9–10.3)
Chloride: 103 mmol/L (ref 98–111)
Creatinine, Ser: 1.39 mg/dL — ABNORMAL HIGH (ref 0.61–1.24)
GFR calc Af Amer: >60 mL/min (ref >60)
GFR calc non Af Amer: 57 mL/min — ABNORMAL LOW (ref >60)
Glucose, Bld: 104 mg/dL — ABNORMAL HIGH (ref 70–99)
Potassium: 4.2 mmol/L (ref 3.5–5.1)
Sodium: 138 mmol/L (ref 135–145)
Total Bilirubin: 1.2 mg/dL (ref 0.3–1.2)
Total Protein: 7.1 g/dL (ref 6.5–8.1)

## 2018-12-29 LAB — LACTIC ACID, PLASMA: Lactic Acid, Venous: 1.4 mmol/L (ref 0.5–1.9)

## 2018-12-29 MED ORDER — SODIUM CHLORIDE 0.9% FLUSH
3.0000 mL | Freq: Once | INTRAVENOUS | Status: AC
  Administered 2018-12-30: 3 mL via INTRAVENOUS

## 2018-12-29 NOTE — ED Triage Notes (Addendum)
Pt presents to ED for evaluation of HTN and headaches since yesterday, hx of migraines but no hx of HTN. States he has a "hangover feeling" like when he gets with migraines. BP 167/151 in Right arm, 162/113 in left arm in triage. During triage pt also reports he hit his left shin on a boulder 1.5 weeks ago and has a reddened spot to his shin. States his leg was really swollen but swelling has improved.

## 2018-12-30 ENCOUNTER — Emergency Department (HOSPITAL_COMMUNITY): Payer: 59

## 2018-12-30 ENCOUNTER — Encounter (HOSPITAL_COMMUNITY): Payer: Self-pay | Admitting: Radiology

## 2018-12-30 MED ORDER — METOCLOPRAMIDE HCL 5 MG/ML IJ SOLN
10.0000 mg | Freq: Once | INTRAMUSCULAR | Status: AC
  Administered 2018-12-30: 10 mg via INTRAVENOUS
  Filled 2018-12-30: qty 2

## 2018-12-30 MED ORDER — IOHEXOL 350 MG/ML SOLN
75.0000 mL | Freq: Once | INTRAVENOUS | Status: AC | PRN
  Administered 2018-12-30: 75 mL via INTRAVENOUS

## 2018-12-30 MED ORDER — AMLODIPINE BESYLATE 5 MG PO TABS: 5.0000 mg | ORAL_TABLET | Freq: Every day | ORAL | 0 refills | Status: DC

## 2018-12-30 MED ORDER — DIPHENHYDRAMINE HCL 50 MG/ML IJ SOLN
25.0000 mg | Freq: Once | INTRAMUSCULAR | Status: AC
  Administered 2018-12-30: 25 mg via INTRAVENOUS
  Filled 2018-12-30: qty 1

## 2018-12-30 MED ORDER — AMLODIPINE BESYLATE 5 MG PO TABS
5.0000 mg | ORAL_TABLET | Freq: Once | ORAL | Status: AC
  Administered 2018-12-30: 02:00:00 5 mg via ORAL
  Filled 2018-12-30: qty 1

## 2018-12-30 MED ORDER — DOXYCYCLINE HYCLATE 100 MG PO CAPS: 100.0000 mg | ORAL_CAPSULE | Freq: Two times a day (BID) | ORAL | 0 refills | Status: DC

## 2018-12-30 MED ORDER — TETANUS-DIPHTH-ACELL PERTUSSIS 5-2.5-18.5 LF-MCG/0.5 IM SUSP
0.5000 mL | Freq: Once | INTRAMUSCULAR | Status: AC
  Administered 2018-12-30: 04:00:00 0.5 mL via INTRAMUSCULAR
  Filled 2018-12-30: qty 0.5

## 2018-12-30 NOTE — ED Notes (Signed)
Discharge instructions discussed with pt. Pt verbalized understanding. Pt stable and ambulatory. No signature pad available. 

## 2018-12-30 NOTE — ED Provider Notes (Signed)
[facility] EMERGENCY DEPARTMENT Provider Note   CSN: [id number] Arrival date & time: 12/29/18  1555     History   Chief Complaint Chief Complaint  Patient presents with   Hypertension   Headache    HPI Leroy Pacheco is a 53 y.o. male.     Patient with history of allergies and PVCs presenting with hypertension.  States his coworker was using a blood pressure cuff and it was reading high so patient checked himself and found it to be reading 200 systolic.  This was a wrist cuff.  Patient states he checked it several different times and it was elevated in the 180s to 200 range. Patient does not take any blood pressure medications.  States his blood pressure is normally in the 120 range when he goes to the doctor.  He denies any chest pain, shortness of breath, focal weakness, numbness or tingling.  Patient states he works out regularly without a problem.  Does admit to "splitting" headache that gradually onset yesterday is progressively worsened.  He has a headache behind his eyes.  He denies thunderclap onset.  The headache is gradually progressed.  States he has history of migraines in the past but normally gets an aura which she did not get with this 1.  Headache is not sudden onset or exertional.  He denies thunderclap onset.  No chest pain or shortness of breath.  No vision changes.  Patient also has a wound to his left shin after striking on a [place] about 10 days ago.  States it is improving.  There is been no bleeding or drainage.  No fever or vomiting.  States he had a bad reaction to a tetanus shot at age 66 with a fever but has since gotten without a problem.  No difficulty breathing or difficulty swallowing with tetanus injection.  The history is provided by the patient.  Hypertension Associated symptoms include headaches. Pertinent negatives include no chest pain, no abdominal pain and no shortness of breath.  Headache Associated symptoms: no abdominal  pain, no congestion, no cough, no dizziness, no fever, no myalgias, no nausea, no photophobia and no vomiting     Past Medical History:  Diagnosis Date   Allergy    PVC's (premature ventricular contractions)     Patient Active Problem List   Diagnosis Date Noted   PVC's (premature ventricular contractions) 03/04/2013    Past Surgical History:  Procedure Laterality Date   APPENDECTOMY     HERNIA REPAIR          Home Medications    Prior to Admission medications   Medication Sig Start Date End Date Taking? Authorizing Provider  fish oil-omega-3 fatty acids 1000 MG capsule Take 2 g by mouth daily.    [provider]  fluticasone (FLONASE) 50 MCG/ACT nasal spray Place into both nostrils daily.    [provider]    Family History Family History  Problem Relation Age of Onset   Cancer Father    Stroke Maternal Grandmother    COPD Maternal Grandfather    Diabetes Paternal Grandmother     Social History Social History   Tobacco Use   Smoking status: Never Smoker   Smokeless tobacco: Never Used  Substance Use Topics   Alcohol use: Yes    Alcohol/week: 2.0 standard drinks    Types: 2 Standard drinks or equivalent per week   Drug use: No     Allergies   Codeine and Tetanus toxoids  Review of Systems Review of Systems  Constitutional: Negative for activity change, appetite change and fever.  HENT: Negative for congestion and rhinorrhea.   Eyes: Negative for photophobia and visual disturbance.  Respiratory: Negative for cough, chest tightness and shortness of breath.   Cardiovascular: Negative for chest pain.  Gastrointestinal: Negative for abdominal pain, nausea and vomiting.  Genitourinary: Negative for dysuria and hematuria.  Musculoskeletal: Negative for arthralgias and myalgias.  Skin: Positive for wound.  Neurological: Positive for headaches. Negative for dizziness and light-headedness.   all other systems are negative  except as noted in the HPI and PMH.     Physical Exam Updated Vital Signs BP (!) 152/110 (BP Location: Left Arm)    Pulse 62    Temp 98 F (36.7 C) (Oral)    Resp 18    SpO2 98%   Physical Exam Vitals signs and nursing note reviewed.  Constitutional:      General: He is not in acute distress.    Appearance: He is well-developed.  HENT:     Head: Normocephalic and atraumatic.     Comments: No temporal artery tenderness    Mouth/Throat:     Pharynx: No oropharyngeal exudate.  Eyes:     Conjunctiva/sclera: Conjunctivae normal.     Pupils: Pupils are equal, round, and reactive to light.  Neck:     Musculoskeletal: Normal range of motion and neck supple.     Comments: No meningismus. Cardiovascular:     Rate and Rhythm: Normal rate and regular rhythm.     Heart sounds: Normal heart sounds. No murmur.  Pulmonary:     Effort: Pulmonary effort is normal. No respiratory distress.     Breath sounds: Normal breath sounds.  Abdominal:     Palpations: Abdomen is soft.     Tenderness: There is no abdominal tenderness. There is no guarding or rebound.  Musculoskeletal: Normal range of motion.        General: Signs of injury present. No tenderness.     Comments:  Left shin with crusted lesion with surrounding erythema.  No fluctuance.  Skin:    General: Skin is warm.     Capillary Refill: Capillary refill takes less than 2 seconds.  Neurological:     General: No focal deficit present.     Mental Status: He is alert and oriented to person, place, and time. Mental status is at baseline.     Cranial Nerves: No cranial nerve deficit.     Motor: No abnormal muscle tone.     Coordination: Coordination normal.     Comments: No ataxia on finger to nose bilaterally. No pronator drift. 5/5 strength throughout. CN 2-12 intact.Equal grip strength. Sensation intact.   Psychiatric:        Behavior: Behavior normal.      ED Treatments / Results  Labs (all labs ordered are listed, but only  abnormal results are displayed) Labs Reviewed  COMPREHENSIVE METABOLIC PANEL - Abnormal; Notable for the following components:      Result Value   Glucose, Bld 104 (*)    Creatinine, Ser 1.39 (*)    ALT 62 (*)    GFR calc non Af Amer 57 (*)    All other components within normal limits  LACTIC ACID, PLASMA  CBC WITH DIFFERENTIAL/PLATELET  LACTIC ACID, PLASMA    EKG EKG Interpretation  Date/Time:  Tuesday December 30 2018 01:31:40 EDT Ventricular Rate:  69 PR Interval:    QRS Duration: 94 QT Interval:  420 QTC Calculation: 450 R Axis:   28 Text Interpretation:  Sinus rhythm Low voltage, precordial leads No previous ECGs available Confirmed by Glynn Octave [phone]) on 12/30/2018 1:49:20 AM   Radiology Ct Angio Head W Or Wo Contrast  Result Date: 12/30/2018 CLINICAL DATA:  Headaches and hypertension.  History of migraine. EXAM: CT ANGIOGRAPHY HEAD AND NECK TECHNIQUE: Multidetector CT imaging of the head and neck was performed using the standard protocol during bolus administration of intravenous contrast. Multiplanar CT image reconstructions and MIPs were obtained to evaluate the vascular anatomy. Carotid stenosis measurements (when applicable) are obtained utilizing NASCET criteria, using the distal internal carotid diameter as the denominator. CONTRAST:  9mL OMNIPAQUE IOHEXOL 350 MG/ML SOLN COMPARISON:  None. FINDINGS: CT HEAD FINDINGS Brain: There is no mass, hemorrhage or extra-axial collection. The size and configuration of the ventricles and extra-axial CSF spaces are normal. There is no acute or chronic infarction. The brain parenchyma is normal. Skull: The visualized skull base, calvarium and extracranial soft tissues are normal. Sinuses/Orbits: No fluid levels or advanced mucosal thickening of the visualized paranasal sinuses. No mastoid or middle ear effusion. The orbits are normal. CTA NECK FINDINGS SKELETON: There is no bony spinal canal stenosis. No lytic or blastic lesion.  OTHER NECK: Normal pharynx, larynx and major salivary glands. No cervical lymphadenopathy. Unremarkable thyroid gland. UPPER CHEST: No pneumothorax or pleural effusion. No nodules or masses. AORTIC ARCH: There is no calcific atherosclerosis of the aortic arch. There is no aneurysm, dissection or hemodynamically significant stenosis of the visualized ascending aorta and aortic arch. Conventional 3 vessel aortic branching pattern. The visualized proximal subclavian arteries are widely patent. RIGHT CAROTID SYSTEM: --Common carotid artery: Widely patent origin without common carotid artery dissection or aneurysm. --Internal carotid artery: Normal without aneurysm, dissection or stenosis. --External carotid artery: No acute abnormality. LEFT CAROTID SYSTEM: --Common carotid artery: Widely patent origin without common carotid artery dissection or aneurysm. --Internal carotid artery: Normal without aneurysm, dissection or stenosis. --External carotid artery: No acute abnormality. VERTEBRAL ARTERIES: Left dominant configuration. Both origins are clearly patent. No dissection, occlusion or flow-limiting stenosis to the skull base (V1-V3 segments). CTA HEAD FINDINGS POSTERIOR CIRCULATION: --Vertebral arteries: Normal V4 segments. --Posterior inferior cerebellar arteries (PICA): Patent origins from the vertebral arteries. --Anterior inferior cerebellar arteries (AICA): Patent origins from the basilar artery. --Basilar artery: Normal. --Superior cerebellar arteries: Normal. --Posterior cerebral arteries (PCA): Normal. Both originate from the basilar artery. Posterior communicating arteries (p-comm) are diminutive or absent. ANTERIOR CIRCULATION: --Intracranial internal carotid arteries: Normal. --Anterior cerebral arteries (ACA): Normal. Both A1 segments are present. Patent anterior communicating artery (a-comm). --Middle cerebral arteries (MCA): Normal. VENOUS SINUSES: As permitted by contrast timing, patent. ANATOMIC  VARIANTS: None Review of the MIP images confirms the above findings. IMPRESSION: Normal CTA of the head and neck. Electronically Signed   By: Deatra Robinson M.D.   On: 12/30/2018 03:14   Ct Angio Neck W And/or Wo Contrast  Result Date: 12/30/2018 CLINICAL DATA:  Headaches and hypertension.  History of migraine. EXAM: CT ANGIOGRAPHY HEAD AND NECK TECHNIQUE: Multidetector CT imaging of the head and neck was performed using the standard protocol during bolus administration of intravenous contrast. Multiplanar CT image reconstructions and MIPs were obtained to evaluate the vascular anatomy. Carotid stenosis measurements (when applicable) are obtained utilizing NASCET criteria, using the distal internal carotid diameter as the denominator. CONTRAST:  100mL OMNIPAQUE IOHEXOL 350 MG/ML SOLN COMPARISON:  None. FINDINGS: CT HEAD FINDINGS Brain: There is no mass, hemorrhage or extra-axial  collection. The size and configuration of the ventricles and extra-axial CSF spaces are normal. There is no acute or chronic infarction. The brain parenchyma is normal. Skull: The visualized skull base, calvarium and extracranial soft tissues are normal. Sinuses/Orbits: No fluid levels or advanced mucosal thickening of the visualized paranasal sinuses. No mastoid or middle ear effusion. The orbits are normal. CTA NECK FINDINGS SKELETON: There is no bony spinal canal stenosis. No lytic or blastic lesion. OTHER NECK: Normal pharynx, larynx and major salivary glands. No cervical lymphadenopathy. Unremarkable thyroid gland. UPPER CHEST: No pneumothorax or pleural effusion. No nodules or masses. AORTIC ARCH: There is no calcific atherosclerosis of the aortic arch. There is no aneurysm, dissection or hemodynamically significant stenosis of the visualized ascending aorta and aortic arch. Conventional 3 vessel aortic branching pattern. The visualized proximal subclavian arteries are widely patent. RIGHT CAROTID SYSTEM: --Common carotid artery:  Widely patent origin without common carotid artery dissection or aneurysm. --Internal carotid artery: Normal without aneurysm, dissection or stenosis. --External carotid artery: No acute abnormality. LEFT CAROTID SYSTEM: --Common carotid artery: Widely patent origin without common carotid artery dissection or aneurysm. --Internal carotid artery: Normal without aneurysm, dissection or stenosis. --External carotid artery: No acute abnormality. VERTEBRAL ARTERIES: Left dominant configuration. Both origins are clearly patent. No dissection, occlusion or flow-limiting stenosis to the skull base (V1-V3 segments). CTA HEAD FINDINGS POSTERIOR CIRCULATION: --Vertebral arteries: Normal V4 segments. --Posterior inferior cerebellar arteries (PICA): Patent origins from the vertebral arteries. --Anterior inferior cerebellar arteries (AICA): Patent origins from the basilar artery. --Basilar artery: Normal. --Superior cerebellar arteries: Normal. --Posterior cerebral arteries (PCA): Normal. Both originate from the basilar artery. Posterior communicating arteries (p-comm) are diminutive or absent. ANTERIOR CIRCULATION: --Intracranial internal carotid arteries: Normal. --Anterior cerebral arteries (ACA): Normal. Both A1 segments are present. Patent anterior communicating artery (a-comm). --Middle cerebral arteries (MCA): Normal. VENOUS SINUSES: As permitted by contrast timing, patent. ANATOMIC VARIANTS: None Review of the MIP images confirms the above findings. IMPRESSION: Normal CTA of the head and neck. Electronically Signed   By: Ulyses Jarred M.D.   On: 12/30/2018 03:14    Procedures Procedures (including critical care time)  Medications Ordered in ED Medications  sodium chloride flush (NS) 0.9 % injection 3 mL (has no administration in time range)  amLODipine (NORVASC) tablet 5 mg (has no administration in time range)  diphenhydrAMINE (BENADRYL) injection 25 mg (has no administration in time range)  metoCLOPramide  (REGLAN) injection 10 mg (has no administration in time range)     Initial Impression / Assessment and Plan / ED Course  I have reviewed the triage vital signs and the nursing notes.  Pertinent labs & imaging results that were available during my care of the patient were reviewed by me and considered in my medical decision making (see chart for details).       Headache with elevated blood pressure.  No sudden onset headache.  No neuro deficits.  Blood pressure 150s to 160s in the ED.  Denies thunderclap onset.  Creatinine 1.3 which appears to be stable.  No evidence of endorgan damage.  CT head is negative.  Nonfocal neurological exam.  Low suspicion for subarachnoid hemorrhage, meningitis, temporal arteritis.  Blood pressure still remains elevated 474 systolic.  Patient started on amlodipine after discussion of risks and benefits of blood pressure medications.  Advised to keep a record of his blood pressure daily basis and follow-up with a primary care doctor for further medication adjustments.  He is given doxycycline for the wound on  his leg and his tetanus is updated. Blood pressure has improved to 150s over 100.  He denies any chest pain, back pain, abdominal pain, headache, visual changes or unilateral weakness. Followup with PCP. Return precautions discussed.    Final Clinical Impressions(s) / ED Diagnoses   Final diagnoses:  Hypertension, unspecified type    ED Discharge Orders    None       Tacarra Justo, Jeannett SeniorStephen, MD 12/30/18 [phone]

## 2018-12-30 NOTE — Discharge Instructions (Signed)
Establish care with a primary doctor.  Take your blood pressure medication as prescribed.  You should keep a record of your blood pressure at the same time every day so your doctor can see how your blood pressures are running.  Return to the ED if you develop chest pain, shortness of breath, dizziness, lightheadedness, unilateral weakness or any other concerns.

## 2020-05-10 ENCOUNTER — Ambulatory Visit (HOSPITAL_COMMUNITY)
Admission: EM | Admit: 2020-05-10 | Discharge: 2020-05-10 | Disposition: A | Discharge status: Home / Self Care | Payer: BC Managed Care – PPO | Attending: Student | Admitting: Student

## 2020-05-10 ENCOUNTER — Encounter (HOSPITAL_COMMUNITY): Payer: Self-pay | Admitting: Emergency Medicine

## 2020-05-10 ENCOUNTER — Other Ambulatory Visit: Payer: Self-pay

## 2020-05-10 DIAGNOSIS — J321 Chronic frontal sinusitis: Secondary | ICD-10-CM | POA: Diagnosis not present

## 2020-05-10 DIAGNOSIS — U071 COVID-19: Secondary | ICD-10-CM

## 2020-05-10 MED ORDER — AMOXICILLIN-POT CLAVULANATE 875-125 MG PO TABS: 1.0000 | ORAL_TABLET | Freq: Two times a day (BID) | ORAL | 0 refills | Status: AC

## 2020-05-10 NOTE — ED Provider Notes (Signed)
[facility]    CSN: [id number] Arrival date & time: 05/10/20  1016      History   Chief Complaint Chief Complaint  Patient presents with  . Covid Positive  . Facial Pain    HPI Leroy Pacheco is a 55 y.o. male Presenting for URI symptoms for 5 days. Positive at home covid test and multiple family members with covid. History of  Allergy and PVCs. Presenting today with sinus pain/pressure and ear pain for 5 days. History of sinus issues, currently taking flonase with some relief. Blowing green snot out. Denies fevers/chills, n/v/d, shortness of breath, chest pain, cough,  teeth pain, headaches, sore throat, loss of taste/smell, swollen lymph nodes.  Denies chest pain, shortness of breath, confusion, high fevers.  Fully vaccinated for covid-19.   HPI  Past Medical History:  Diagnosis Date  . Allergy   . PVC's (premature ventricular contractions)     Patient Active Problem List   Diagnosis Date Noted  . PVC's (premature ventricular contractions) 03/04/2013    Past Surgical History:  Procedure Laterality Date  . APPENDECTOMY    . HERNIA REPAIR         Home Medications    Prior to Admission medications   Medication Sig Start Date End Date Taking? Authorizing Provider  amoxicillin-clavulanate (AUGMENTIN) 875-125 MG tablet Take 1 tablet by mouth every 12 (twelve) hours. 05/10/20  Yes Rhys Martini, PA-C  cetirizine (ZYRTEC) 10 MG tablet Take 10 mg by mouth daily.    [provider]  fish oil-omega-3 fatty acids 1000 MG capsule Take 2 g by mouth daily.    [provider]  fluticasone (FLONASE) 50 MCG/ACT nasal spray Place 2 sprays into both nostrils daily.     [provider]  Olopatadine HCl (PATADAY) 0.2 % SOLN Place 1 drop into both eyes daily.    [provider]  amLODipine (NORVASC) 5 MG tablet Take 1 tablet (5 mg total) by mouth daily. 12/30/18 05/10/20  Glynn Octave, MD    Family History Family History  Problem  Relation Age of Onset  . Cancer Father   . Stroke Maternal Grandmother   . COPD Maternal Grandfather   . Diabetes Paternal Grandmother     Social History Social History   Tobacco Use  . Smoking status: Never Smoker  . Smokeless tobacco: Never Used  Substance Use Topics  . Alcohol use: Yes    Alcohol/week: 2.0 standard drinks    Types: 2 Standard drinks or equivalent per week  . Drug use: No     Allergies   Codeine and Tetanus toxoids   Review of Systems Review of Systems  Constitutional: Negative for appetite change, chills and fever.  HENT: Positive for ear pain and sinus pressure. Negative for congestion, rhinorrhea, sinus pain, sore throat and tinnitus.   Eyes: Negative for redness and visual disturbance.  Respiratory: Negative for cough, chest tightness, shortness of breath and wheezing.   Cardiovascular: Negative for chest pain and palpitations.  Gastrointestinal: Negative for abdominal pain, constipation, diarrhea, nausea and vomiting.  Genitourinary: Negative for dysuria, frequency and urgency.  Musculoskeletal: Negative for myalgias.  Neurological: Negative for dizziness, weakness and headaches.  Psychiatric/Behavioral: Negative for confusion.  All other systems reviewed and are negative.    Physical Exam Triage Vital Signs ED Triage Vitals  Enc Vitals Group     BP 05/10/20 1225 (!) 167/107     Pulse Rate 05/10/20 1225 69     Resp 05/10/20 1225 17  Temp 05/10/20 1225 98.1 F (36.7 C)     Temp Source 05/10/20 1225 Oral     SpO2 05/10/20 1225 96 %     Weight --      Height --      Head Circumference --      Peak Flow --      Pain Score 05/10/20 1223 5     Pain Loc --      Pain Edu? --      Excl. in GC? --    No data found.  Updated Vital Signs BP (!) 167/107 (BP Location: Left Arm)   Pulse 69   Temp 98.1 F (36.7 C) (Oral)   Resp 17   SpO2 96%   Visual Acuity Right Eye Distance:   Left Eye Distance:   Bilateral Distance:    Right  Eye Near:   Left Eye Near:    Bilateral Near:     Physical Exam Vitals reviewed.  Constitutional:      General: He is not in acute distress.    Appearance: Normal appearance. He is not ill-appearing.  HENT:     Head: Normocephalic and atraumatic.     Right Ear: Hearing, ear canal and external ear normal. No swelling or tenderness. A middle ear effusion is present. There is no impacted cerumen. No mastoid tenderness. Tympanic membrane is not perforated, erythematous, retracted or bulging.     Left Ear: Hearing, ear canal and external ear normal. No swelling or tenderness. A middle ear effusion is present. There is no impacted cerumen. No mastoid tenderness. Tympanic membrane is not perforated, erythematous, retracted or bulging.     Nose:     Right Sinus: Frontal sinus tenderness present. No maxillary sinus tenderness.     Left Sinus: Frontal sinus tenderness present. No maxillary sinus tenderness.     Mouth/Throat:     Mouth: Mucous membranes are moist.     Pharynx: Uvula midline. No oropharyngeal exudate or posterior oropharyngeal erythema.     Tonsils: No tonsillar exudate.  Cardiovascular:     Rate and Rhythm: Normal rate and regular rhythm.     Heart sounds: Normal heart sounds.  Pulmonary:     Breath sounds: Normal breath sounds and air entry. No wheezing, rhonchi or rales.  Chest:     Chest wall: No tenderness.  Abdominal:     General: Abdomen is flat. Bowel sounds are normal.     Tenderness: There is no abdominal tenderness. There is no guarding or rebound.  Lymphadenopathy:     Cervical: No cervical adenopathy.  Neurological:     General: No focal deficit present.     Mental Status: He is alert and oriented to person, place, and time.  Psychiatric:        Attention and Perception: Attention and perception normal.        Mood and Affect: Mood and affect normal.        Behavior: Behavior normal. Behavior is cooperative.        Thought Content: Thought content normal.         Judgment: Judgment normal.      UC Treatments / Results  Labs (all labs ordered are listed, but only abnormal results are displayed) Labs Reviewed - No data to display  EKG   Radiology No results found.  Procedures Procedures (including critical care time)  Medications Ordered in UC Medications - No data to display  Initial Impression / Assessment and Plan / UC Course  I have reviewed the triage vital signs and the nursing notes.  Pertinent labs & imaging results that were available during my care of the patient were reviewed by me and considered in my medical decision making (see chart for details).     Covid and influenza tests not sent- pt with multiple covid positive family members and he had a positive at-home covid test. Pt declines PCR test today. Patient is fully vaccinated for covid-19. Isolation precautions per CDC guidelines. Symptomatic relief with OTC Mucinex, Nyquil, etc. Return precautions- new/worsening fevers/chills, shortness of breath, chest pain, abd pain, etc.   For sinusitis, augmentin as below.  Final Clinical Impressions(s) / UC Diagnoses   Final diagnoses:  COVID-19  Chronic frontal sinusitis     Discharge Instructions     -Augmentin twice daily for 7 days -Continue using Flonase, OTC decongestants, etc  Cntinue to isolate at home for 5 days if you have mild symptoms, or a total of 10 days from symptom onset if you have more severe symptoms. If you quarantine for a shorter period of time (i.e. 5 days), make sure to wear a mask until day 10 of symptoms. Treat your symptoms at home with OTC remedies like tylenol/ibuprofen, mucinex, nyquil, etc. Seek medical attention if you develop high fevers, chest pain, shortness of breath, ear pain, facial pain, etc. Make sure to get up and move around every 2-3 hours while convalescing to help prevent blood clots. Drink plenty of fluids, and rest as much as possible.     ED Prescriptions     Medication Sig Dispense Auth. Provider   amoxicillin-clavulanate (AUGMENTIN) 875-125 MG tablet Take 1 tablet by mouth every 12 (twelve) hours. 14 tablet Hazel Sams, PA-C     PDMP not reviewed this encounter.   Hazel Sams, PA-C 05/10/20 1354

## 2020-05-10 NOTE — Discharge Instructions (Addendum)
-  Augmentin twice daily for 7 days -Continue using Flonase, OTC decongestants, etc  Cntinue to isolate at home for 5 days if you have mild symptoms, or a total of 10 days from symptom onset if you have more severe symptoms. If you quarantine for a shorter period of time (i.e. 5 days), make sure to wear a mask until day 10 of symptoms. Treat your symptoms at home with OTC remedies like tylenol/ibuprofen, mucinex, nyquil, etc. Seek medical attention if you develop high fevers, chest pain, shortness of breath, ear pain, facial pain, etc. Make sure to get up and move around every 2-3 hours while convalescing to help prevent blood clots. Drink plenty of fluids, and rest as much as possible.

## 2020-05-10 NOTE — ED Triage Notes (Signed)
Pt presents with sinus pain and pressure and ear pain. States tested positive for COVID on 12/31 with at home test.

## 2021-04-22 IMAGING — CT CT ANGIOGRAPHY NECK
2 of 11 series · 6 of 34 positions shown · IV contrast (APPLIED)
Comparison: None.

CLINICAL DATA: Headaches and hypertension.  History of migraine.

EXAM:
CT ANGIOGRAPHY HEAD AND NECK
TECHNIQUE: Multidetector CT imaging of the head and neck was performed using
the standard protocol during bolus administration of intravenous
contrast. Multiplanar CT image reconstructions and MIPs were
obtained to evaluate the vascular anatomy. Carotid stenosis
measurements (when applicable) are obtained utilizing NASCET
criteria, using the distal internal carotid diameter as the
denominator.
CONTRAST:  75mL OMNIPAQUE IOHEXOL 350 MG/ML SOLN

[Series 9: sag soft · sagittal · 0.36mm/px · 1 of 67 slices shown]
[im 44/67  soft-tissue]
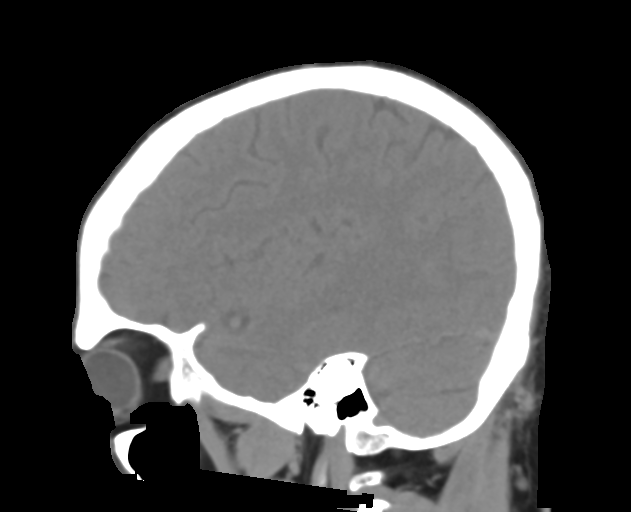

[Series 12: ax thins · axial · 0.39mm/px · z∈[-282,-30]mm · 5 of 380 slices shown]
[im 64/380  soft-tissue]
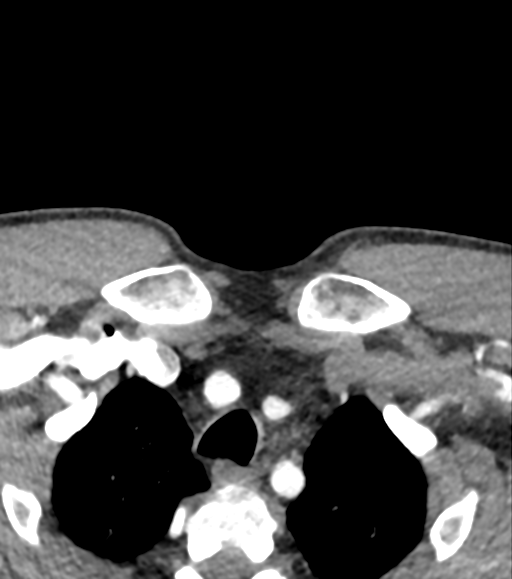
[im 127/380  bone]
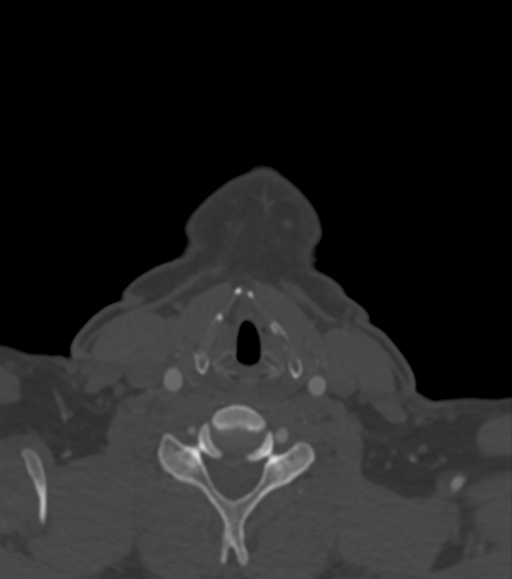
[im 190/380  soft-tissue]
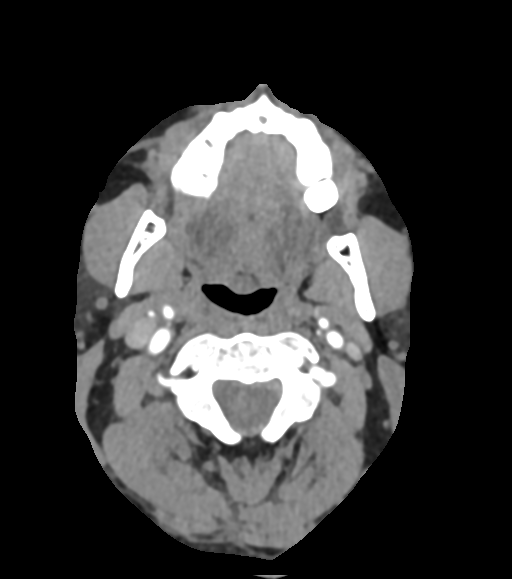
[im 253/380  bone]
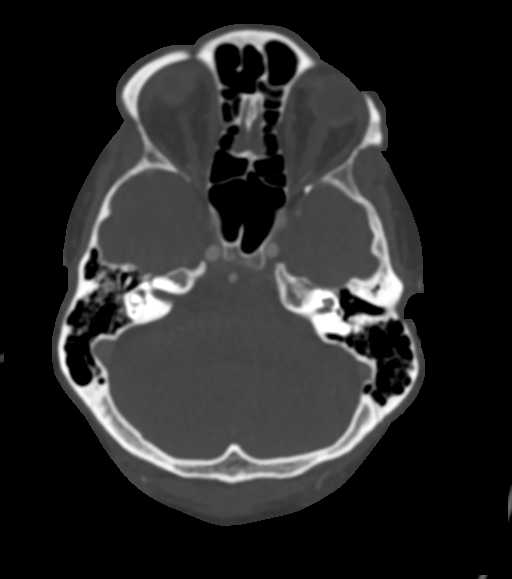
[im 316/380  soft-tissue]
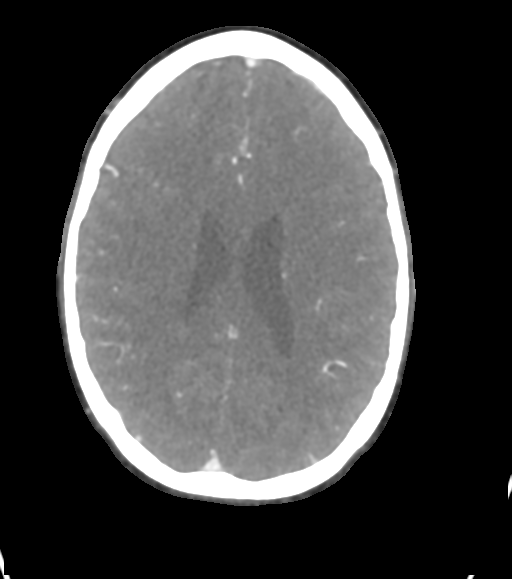

[6 of 34 positions shown; findings below may reference images not displayed]

FINDINGS: CT HEAD FINDINGS

Brain: There is no mass, hemorrhage or extra-axial collection. The
size and configuration of the ventricles and extra-axial CSF spaces
are normal. There is no acute or chronic infarction. The brain
parenchyma is normal.

Skull: The visualized skull base, calvarium and extracranial soft
tissues are normal.

Sinuses/Orbits: No fluid levels or advanced mucosal thickening of
the visualized paranasal sinuses. No mastoid or middle ear effusion.
The orbits are normal.

CTA NECK FINDINGS

SKELETON: There is no bony spinal canal stenosis. No lytic or
blastic lesion.

OTHER NECK: Normal pharynx, larynx and major salivary glands. No
cervical lymphadenopathy. Unremarkable thyroid gland.

UPPER CHEST: No pneumothorax or pleural effusion. No nodules or
masses.

AORTIC ARCH:

There is no calcific atherosclerosis of the aortic arch. There is no
aneurysm, dissection or hemodynamically significant stenosis of the
visualized ascending aorta and aortic arch.

Conventional 3 vessel aortic branching pattern.

The visualized proximal subclavian arteries are widely patent.

RIGHT CAROTID SYSTEM:

--Common carotid artery: Widely patent origin without common carotid
artery dissection or aneurysm.

--Internal carotid artery: Normal without aneurysm, dissection or
stenosis.

--External carotid artery: No acute abnormality.

LEFT CAROTID SYSTEM:

--Common carotid artery: Widely patent origin without common carotid
artery dissection or aneurysm.

--Internal carotid artery: Normal without aneurysm, dissection or
stenosis.

--External carotid artery: No acute abnormality.

VERTEBRAL ARTERIES: Left dominant configuration.

Both origins are clearly patent.

No dissection, occlusion or flow-limiting stenosis to the skull base
(V1-V3 segments).

CTA HEAD FINDINGS

POSTERIOR CIRCULATION:

--Vertebral arteries: Normal V4 segments.

--Posterior inferior cerebellar arteries (PICA): Patent origins from
the vertebral arteries.

--Anterior inferior cerebellar arteries (AICA): Patent origins from
the basilar artery.

--Basilar artery: Normal.

--Superior cerebellar arteries: Normal.

--Posterior cerebral arteries (PCA): Normal. Both originate from the
basilar artery. Posterior communicating arteries (p-comm) are
diminutive or absent.

ANTERIOR CIRCULATION:

--Intracranial internal carotid arteries: Normal.

--Anterior cerebral arteries (ACA): Normal. Both A1 segments are
present. Patent anterior communicating artery (a-comm).

--Middle cerebral arteries (MCA): Normal.

VENOUS SINUSES: As permitted by contrast timing, patent.

ANATOMIC VARIANTS: None

Review of the MIP images confirms the above findings.
IMPRESSION: Normal CTA of the head and neck.
